# Patient Record
Sex: Female | Born: 1975 | Marital: Single
Health system: Southern US, Community
[De-identification: ages and names within clinical notes are randomized; demographics above are authoritative.]

---

## 2011-09-21 ENCOUNTER — Inpatient Hospital Stay: Payer: Self-pay | Admitting: Specialist

## 2011-09-21 LAB — URINALYSIS, COMPLETE
Bilirubin,UR: NEGATIVE
Glucose,UR: >=500 mg/dL (ref 0–75)
Nitrite: NEGATIVE
Ph: 5 (ref 4.5–8.0)
Protein: NEGATIVE
RBC,UR: 2 /HPF (ref 0–5)
Specific Gravity: 1.024 (ref 1.003–1.030)
WBC UR: 5 /HPF (ref 0–5)

## 2011-09-21 LAB — CBC
HCT: 42.6 % (ref 35.0–47.0)
HGB: 13.2 g/dL (ref 12.0–16.0)
MCH: 29 pg (ref 26.0–34.0)
MCHC: 31 g/dL — ABNORMAL LOW (ref 32.0–36.0)
MCV: 94 fL (ref 80–100)
Platelet: 318 10*3/uL (ref 150–440)
RBC: 4.55 10*6/uL (ref 3.80–5.20)
RDW: 12.7 % (ref 11.5–14.5)

## 2011-09-21 LAB — COMPREHENSIVE METABOLIC PANEL
Alkaline Phosphatase: 140 U/L — ABNORMAL HIGH (ref 50–136)
Anion Gap: 21 — ABNORMAL HIGH (ref 7–16)
BUN: 14 mg/dL (ref 7–18)
Bilirubin,Total: 0.6 mg/dL (ref 0.2–1.0)
Calcium, Total: 9.7 mg/dL (ref 8.5–10.1)
Co2: 17 mmol/L — ABNORMAL LOW (ref 21–32)
Creatinine: 0.83 mg/dL (ref 0.60–1.30)
EGFR (Non-African Amer.): >60
Glucose: 505 mg/dL (ref 65–99)
Osmolality: 299 (ref 275–301)
Potassium: 4.8 mmol/L (ref 3.5–5.1)
SGOT(AST): 18 U/L (ref 15–37)
Sodium: 138 mmol/L (ref 136–145)
Total Protein: 7.8 g/dL (ref 6.4–8.2)

## 2011-09-22 DIAGNOSIS — I471 Supraventricular tachycardia: Secondary | ICD-10-CM

## 2011-09-22 LAB — HEMOGLOBIN A1C: Hemoglobin A1C: 9.7 % — ABNORMAL HIGH (ref 4.2–6.3)

## 2011-09-22 LAB — BASIC METABOLIC PANEL
Anion Gap: 10 (ref 7–16)
BUN: 11 mg/dL (ref 7–18)
BUN: 10 mg/dL (ref 7–18)
Calcium, Total: 7.9 mg/dL — ABNORMAL LOW (ref 8.5–10.1)
Chloride: 124 mmol/L — ABNORMAL HIGH (ref 98–107)
Chloride: 124 mmol/L — ABNORMAL HIGH (ref 98–107)
Co2: 20 mmol/L — ABNORMAL LOW (ref 21–32)
EGFR (African American): >60
EGFR (Non-African Amer.): >60
Glucose: 124 mg/dL — ABNORMAL HIGH (ref 65–99)
Osmolality: 304 (ref 275–301)
Osmolality: 304 (ref 275–301)
Sodium: 154 mmol/L — ABNORMAL HIGH (ref 136–145)

## 2011-09-22 LAB — CK TOTAL AND CKMB (NOT AT ARMC)
CK, Total: 36 U/L (ref 21–215)
CK-MB: 0.5 ng/mL (ref 0.5–3.6)
CK-MB: 0.7 ng/mL (ref 0.5–3.6)
CK-MB: 1.8 ng/mL (ref 0.5–3.6)

## 2011-09-22 LAB — CBC WITH DIFFERENTIAL/PLATELET
Basophil #: 0.1 10*3/uL (ref 0.0–0.1)
Basophil %: 0.3 %
HCT: 38.3 % (ref 35.0–47.0)
HGB: 11.7 g/dL — ABNORMAL LOW (ref 12.0–16.0)
Lymphocyte %: 2.9 %
MCH: 28.8 pg (ref 26.0–34.0)
MCHC: 30.6 g/dL — ABNORMAL LOW (ref 32.0–36.0)
Monocyte %: 2.8 %
Neutrophil #: 28.4 10*3/uL — ABNORMAL HIGH (ref 1.4–6.5)
Neutrophil %: 94 %
Platelet: 295 10*3/uL (ref 150–440)
WBC: 30.2 10*3/uL — ABNORMAL HIGH (ref 3.6–11.0)

## 2011-09-22 LAB — LIPID PANEL
Cholesterol: 194 mg/dL (ref 0–200)
Ldl Cholesterol, Calc: 136 mg/dL — ABNORMAL HIGH (ref 0–100)
Triglycerides: 90 mg/dL (ref 0–200)
VLDL Cholesterol, Calc: 18 mg/dL (ref 5–40)

## 2011-09-22 LAB — COMPREHENSIVE METABOLIC PANEL
Albumin: 3 g/dL — ABNORMAL LOW (ref 3.4–5.0)
Alkaline Phosphatase: 128 U/L (ref 50–136)
Anion Gap: 25 — ABNORMAL HIGH (ref 7–16)
BUN: 17 mg/dL (ref 7–18)
Bilirubin,Total: 0.5 mg/dL (ref 0.2–1.0)
Calcium, Total: 8.3 mg/dL — ABNORMAL LOW (ref 8.5–10.1)
Creatinine: 0.78 mg/dL (ref 0.60–1.30)
Glucose: 380 mg/dL — ABNORMAL HIGH (ref 65–99)
Osmolality: 311 (ref 275–301)
Potassium: 4.6 mmol/L (ref 3.5–5.1)
SGOT(AST): 14 U/L — ABNORMAL LOW (ref 15–37)
Sodium: 148 mmol/L — ABNORMAL HIGH (ref 136–145)
Total Protein: 6.9 g/dL (ref 6.4–8.2)

## 2011-09-22 LAB — LIPASE, BLOOD: Lipase: 31 U/L — ABNORMAL LOW (ref 73–393)

## 2011-09-22 LAB — TROPONIN I
Troponin-I: <0.02 ng/mL
Troponin-I: 0.32 ng/mL — ABNORMAL HIGH

## 2011-09-23 LAB — CBC WITH DIFFERENTIAL/PLATELET
Eosinophil #: 0 10*3/uL (ref 0.0–0.7)
Eosinophil %: 0 %
HCT: 32.3 % — ABNORMAL LOW (ref 35.0–47.0)
Lymphocyte %: 8.7 %
MCV: 89 fL (ref 80–100)
Monocyte #: 1.4 x10 3/mm — ABNORMAL HIGH (ref 0.2–0.9)
Monocyte %: 7.1 %
Neutrophil %: 84 %
Platelet: 237 10*3/uL (ref 150–440)
RBC: 3.62 10*6/uL — ABNORMAL LOW (ref 3.80–5.20)
RDW: 12.7 % (ref 11.5–14.5)
WBC: 20 10*3/uL — ABNORMAL HIGH (ref 3.6–11.0)

## 2011-09-23 LAB — BASIC METABOLIC PANEL
BUN: 4 mg/dL — ABNORMAL LOW (ref 7–18)
Chloride: 113 mmol/L — ABNORMAL HIGH (ref 98–107)
Co2: 23 mmol/L (ref 21–32)
Creatinine: 0.87 mg/dL (ref 0.60–1.30)
EGFR (African American): >60
EGFR (Non-African Amer.): >60
Glucose: 212 mg/dL — ABNORMAL HIGH (ref 65–99)
Osmolality: 294 (ref 275–301)
Sodium: 146 mmol/L — ABNORMAL HIGH (ref 136–145)

## 2011-09-23 LAB — URINE CULTURE

## 2011-09-24 LAB — CBC WITH DIFFERENTIAL/PLATELET
Basophil #: 0 10*3/uL (ref 0.0–0.1)
Basophil %: 0.4 %
Eosinophil #: 0 10*3/uL (ref 0.0–0.7)
HGB: 10.9 g/dL — ABNORMAL LOW (ref 12.0–16.0)
Lymphocyte %: 13 %
MCH: 28.5 pg (ref 26.0–34.0)
MCHC: 32.1 g/dL (ref 32.0–36.0)
Monocyte #: 1.2 x10 3/mm — ABNORMAL HIGH (ref 0.2–0.9)
Neutrophil #: 9.4 10*3/uL — ABNORMAL HIGH (ref 1.4–6.5)
Neutrophil %: 76.9 %
Platelet: 251 10*3/uL (ref 150–440)
RBC: 3.81 10*6/uL (ref 3.80–5.20)
WBC: 12.3 10*3/uL — ABNORMAL HIGH (ref 3.6–11.0)

## 2011-09-24 LAB — BASIC METABOLIC PANEL
Anion Gap: 11 (ref 7–16)
BUN: 3 mg/dL — ABNORMAL LOW (ref 7–18)
Chloride: 109 mmol/L — ABNORMAL HIGH (ref 98–107)
Creatinine: 0.61 mg/dL (ref 0.60–1.30)
EGFR (Non-African Amer.): >60
Osmolality: 282 (ref 275–301)
Potassium: 3.1 mmol/L — ABNORMAL LOW (ref 3.5–5.1)
Sodium: 145 mmol/L (ref 136–145)

## 2011-09-27 LAB — CULTURE, BLOOD (SINGLE)

## 2014-03-21 IMAGING — CR DG CHEST 1V PORT
1 series · 1 of 1 positions shown · non-contrast
Comparison: none

REASON FOR EXAM: central line placement
COMMENTS:

[portable]
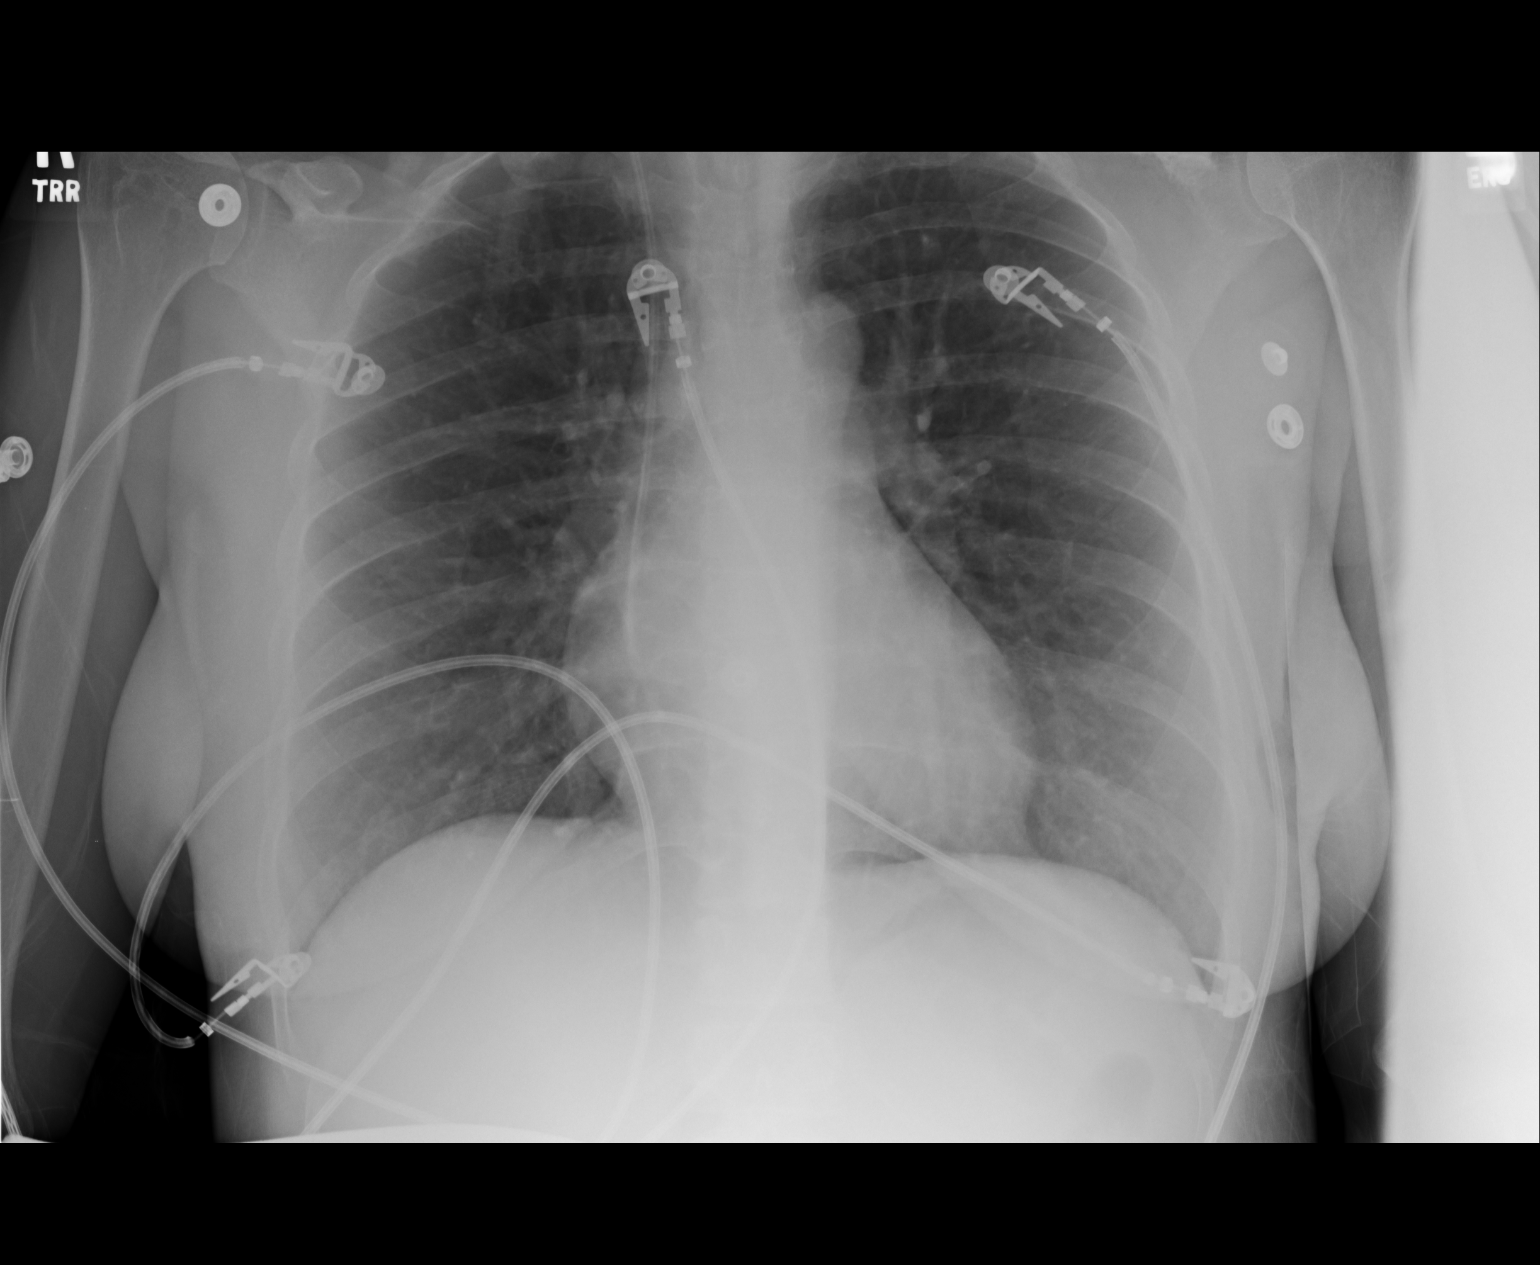

[1 of 1 positions shown; findings below may reference images not displayed]

PROCEDURE:     DXR - DXR PORTABLE CHEST SINGLE VIEW  - September 22, 2011  [DATE]

RESULT:

A right-sided central venous catheter is appreciated with the tip projecting
in the region of the right atrium. There is mild prominence of the
interstitial markings. No focal regions of consolidation or focal
infiltrates are appreciated. The cardiac silhouette and visualized bony
skeleton are unremarkable.
IMPRESSION: 1.  Mild interstitial prominence possibly representing a component of
interstitial fibrosis versus a component of edema.
2. Support lines as described above.

## 2014-06-20 NOTE — Discharge Summary (Signed)
PATIENT NAME:  Caroline GriffinsHOUSE, [name] MR#:  [id number] DATE OF BIRTH:  May 04, 1975  DATE OF ADMISSION:  09/21/2011 DATE OF DISCHARGE:  09/24/2011  For a detailed note, please take a look at the history and physical done by Dr. Richarda OverlieNayana Abrol on admission.   DISCHARGE DIAGNOSES: 1. Acute diabetic ketoacidosis likely secondary to medical noncompliance.  2. Acute renal failure. 3. Hypernatremia. 4. Hypokalemia.  5. Leukocytosis.   DIET: The patient is being discharged home on an American Diabetic Association Diet.   ACTIVITY: As tolerated.   DISCHARGE FOLLOWUP: Followup is with [place] primary [place] physician in [place], [place].   DISCHARGE MEDICATIONS:  1. Levemir 18 units at bedtime.  2. Novolin Regular insulin sliding scale.  CONSULTANTS: Tiney Rougealph Ely, MD (For central venous catheter placement).   PERTINENT STUDIES: Chest x-ray done on 09/22/2011 showed mild interstitial prominence but no other acute cardiopulmonary disease.   The patient's blood and urine cultures essentially are with no growth.   BRIEF HOSPITAL COURSE: This is a 39 year old female with medical problems as mentioned above who presented to the hospital with nausea and vomiting and noted to be in diabetic ketoacidosis.  1. Diabetic ketoacidosis. This was likely secondary to medical noncompliance. There was no evidence of any acute infectious source. The patient was started on IV Rocephin to cover for possible underlying urinary tract infection, although her urine cultures are negative. As far as her DKA is concerned, the patient was started on an insulin drip, aggressive IV fluids, and was taken off the insulin drip once the anion gap closed. She continued to have some persistent nausea and vomiting which improved with some p.r.n. Reglan. Clinically now she is able to tolerate p.o. well with no further nausea or vomiting and is being discharged back on her Levemir and Humalog sliding scale as mentioned. She was strongly advised  to have a follow-up with [place] primary care physician in [place], [place] shortly after discharge.  2. Acute renal failure. This was likely secondary to dehydration from hyperglycemia and persistent nausea and vomiting. This improved with aggressive IV fluid hydration and since then has resolved.  3. Hypernatremia. Also secondary to dehydration. The patient was given IV fluids along with some D5W and her sodium since then has normalized.  4. Hypokalemia. This was likely related to the fact that she was on an insulin drip and it has since then been supplemented and improved and resolved now.  5. Leukocytosis. This was likely stress margination from her DKA. Her white cell count has since then normalized. She is afebrile. Her blood cultures and urine cultures have remained negative.   CODE STATUS: THE PATIENT IS A FULL CODE.   TIME SPENT: 40 minutes.  ____________________________ Rolly PancakeVivek J. Cherlynn KaiserSainani, MD vjs:slb D: 09/24/2011 15:47:18 ET T: 09/25/2011 11:39:30 ET JOB#: [id number]  cc: Rolly PancakeVivek J. Cherlynn KaiserSainani, MD, <Dictator> Houston SirenVIVEK J SAINANI MD ELECTRONICALLY SIGNED 09/26/2011 16:19

## 2014-06-20 NOTE — H&P (Signed)
PATIENT NAME:  Caroline GriffinsHOUSE, Katiya B MR#:  [id number] DATE OF BIRTH:  [birth year]  DATE OF ADMISSION:  09/21/2011  PRIMARY CARE PHYSICIAN: [facility] [employer] [employer]   SUBJECTIVE: This is a 39 year old female with a known history of type I diabetes since 1995 on Levemir and Humalog at home who presents today because of severe and persistent nausea, vomiting, and abdominal pain. She also complains of back pain and flank pain. She has also experienced some urinary urgency, frequency, dysuria, and a foul smelling vaginal odor over the course of the last few days. She denies any fever, chills, or rigors at home. She was found to have DKA in the ER and is being admitted.   PAST MEDICAL HISTORY: History of DKA.   PAST SURGICAL HISTORY: Cesarean section nine years ago.   HOME MEDICATIONS:  1. Levemir 18 units in the evening. 2. Humalog sliding scale.   ALLERGIES: None known drug allergies.   REVIEW OF SYSTEMS: CONSTITUTIONAL: Negative for fever, fatigue, weakness, weight loss or weight gain. EYES: Negative for blurry vision, double vision, pain, redness, inflammation, glaucoma. ENT: Negative for tinnitus, ear pain, hearing loss, allergies. RESPIRATORY: Negative for cough, wheezing, hemoptysis, dyspnea. CARDIOVASCULAR: Negative for chest pain, orthopnea, edema, arrhythmia. GI: Complaining of nausea, vomiting, abdominal pain, flank pain. GU: Denies dysuria, hematuria, renal calculi. ENDOCRINE: Denies any polyuria, nocturia, thyroid problems. INTEGUMENTARY: No acne, rash, lesions. NEUROLOGIC: No numbness, weakness, dysarthria, epilepsy. PSYCHIATRIC: No insomnia, bipolar disorder, depression.   PHYSICAL EXAMINATION:   VITAL SIGNS: Pulse 133, respirations 18, blood pressure 91/51, 98% on room air.   GENERAL: Currently looks in moderate distress because of her persistent nausea, vomiting, and abdominal pain.   HEENT: Pupils equal and reactive to light. Extraocular movements intact. Sclerae anicteric.   NECK:  Supple. No JVD.   LUNGS: Clear to auscultation bilaterally. No wheezes, crackles, rhonchi.   CARDIOVASCULAR: Regular rate and rhythm. No murmurs, rubs, or gallops.   ABDOMEN: Normoactive bowel sounds. No CVA tenderness. Soft, nontender, nondistended.   EXTREMITIES: Without cyanosis, clubbing, or edema.   NEUROLOGIC: Cranial nerves II through XII grossly intact. Motor strength intact in bilateral upper and lower extremities. Deep tendon reflexes 2+ bilaterally.   PSYCHIATRIC: Appropriate mood and affect.   LYMPHATIC: No axillary, inguinal, or cervical lymphadenopathy.   SKIN: Without any skin rashes or moles.   LABORATORY, DIAGNOSTIC, AND RADIOLOGICAL DATA: pH 7.15, pO2 119, FiO2 21%. Urinalysis shows 5 WBCs per high-power field and trace amount of leukocyte esterase. WBC 24.9, hemoglobin 13.2, hematocrit 42.6, platelet count 94, glucose 505, creatinine 0.83, BUN 14, sodium 138, potassium 4.8, chloride 100, bicarb 17, anion gap 21.   ASSESSMENT AND PLAN:  1. DKA.  2. Leukocytosis likely secondary to recent urinary tract infection with persistent symptoms of cystitis although her urinalysis does not look that bad.   PLAN:  1. The patient will be treated per ICU DKA protocol.  2. She will be kept n.p.o. until her DKA resolve.  3. Will empirically start on Rocephin given her elevated white count which could simply be secondary to stress margination, however, the patient is still complaining of some suprapubic discomfort.  4. Blood cultures will be drawn.   CODE STATUS: She is a FULL CODE.   TIME SPENT DOING THIS ADMISSION: 60 minutes.   ____________________________ Richarda OverlieNayana Tonantzin Mimnaugh, MD na:drc D: 09/22/2011 00:02:18 ET T: 09/22/2011 06:07:06 ET JOB#: [id number]  cc: Richarda OverlieNayana Saroya Riccobono, MD, <Dictator> Richarda OverlieNAYANA Trevon Strothers MD ELECTRONICALLY SIGNED 09/24/2011 3:10

## 2014-06-20 NOTE — Op Note (Signed)
PATIENT NAME:  Caroline Gomez, Caroline Gomez MR#:  [id number] DATE OF BIRTH:  1975-07-19  DATE OF PROCEDURE:  09/22/2011  PREOPERATIVE DIAGNOSIS: Diabetic ketoacidosis.   POSTOPERATIVE DIAGNOSIS: Diabetic ketoacidosis.   OPERATION: Right central line placement.   ANESTHESIA: Local.   SURGEON: Quentin Orealph L. Ely, III, MD    OPERATIVE PROCEDURE: With the patient in the supine position after the induction of appropriate sedation, the patient's right neck was prepped with ChloraPrep and draped with sterile towels. The neck was interrogated with the ultrasound. 1% Xylocaine was injected and the area identified by the ultrasound as just above the jugular vein. Under direct vision the jugular vein was cannulated on a single pass and a wire passed into the great vessel system without difficulty. The skin incision was slightly enlarged and dilator placed over the wire into the great vessel system. Dilator was removed and the triple lumen catheter inserted over the wire without difficulty. The catheter was secured in place with Bio Spot and 3-0 silk sutures. Sterile dressing was applied. All ports were flushed and secured. Chest x-ray is pending.   ____________________________ Carmie Endalph L. Ely III, MD rle:drc D: 09/22/2011 02:14:04 ET T: 09/22/2011 08:28:27 ET JOB#: [id number]  cc: Quentin Orealph L. Ely III, MD, <Dictator> Quentin OreALPH L ELY MD ELECTRONICALLY SIGNED 09/23/2011 22:43
